# Patient Record
Sex: Female | Born: 1950 | Race: White | Hispanic: No | Smoking: Former smoker
Health system: Southern US, Community
[De-identification: ages and names within clinical notes are randomized; demographics above are authoritative.]

## PROBLEM LIST (undated history)

## (undated) DIAGNOSIS — M810 Age-related osteoporosis without current pathological fracture: Secondary | ICD-10-CM

## (undated) DIAGNOSIS — T148XXA Other injury of unspecified body region, initial encounter: Secondary | ICD-10-CM

## (undated) HISTORY — DX: Age-related osteoporosis without current pathological fracture: M81.0

## (undated) HISTORY — DX: Other injury of unspecified body region, initial encounter: T14.8XXA

---

## 2004-03-24 ENCOUNTER — Encounter: Admission: RE | Admit: 2004-03-24 | Discharge: 2004-03-24 | Payer: Self-pay | Admitting: Obstetrics and Gynecology

## 2004-04-01 ENCOUNTER — Other Ambulatory Visit: Admission: RE | Admit: 2004-04-01 | Discharge: 2004-04-01 | Payer: Self-pay | Admitting: Obstetrics and Gynecology

## 2004-07-20 ENCOUNTER — Ambulatory Visit (HOSPITAL_COMMUNITY): Admission: RE | Admit: 2004-07-20 | Discharge: 2004-07-20 | Payer: Self-pay | Admitting: Gastroenterology

## 2005-04-04 ENCOUNTER — Encounter: Admission: RE | Admit: 2005-04-04 | Discharge: 2005-04-04 | Payer: Self-pay | Admitting: Obstetrics and Gynecology

## 2005-04-21 ENCOUNTER — Other Ambulatory Visit: Admission: RE | Admit: 2005-04-21 | Discharge: 2005-04-21 | Payer: Self-pay | Admitting: Obstetrics and Gynecology

## 2006-02-13 ENCOUNTER — Emergency Department (HOSPITAL_COMMUNITY): Admission: EM | Admit: 2006-02-13 | Discharge: 2006-02-13 | Payer: Self-pay | Admitting: Emergency Medicine

## 2006-04-11 ENCOUNTER — Encounter: Admission: RE | Admit: 2006-04-11 | Discharge: 2006-04-11 | Payer: Self-pay | Admitting: Internal Medicine

## 2006-09-18 DIAGNOSIS — E039 Hypothyroidism, unspecified: Secondary | ICD-10-CM

## 2006-09-18 HISTORY — DX: Hypothyroidism, unspecified: E03.9

## 2009-09-18 DIAGNOSIS — S3992XA Unspecified injury of lower back, initial encounter: Secondary | ICD-10-CM

## 2009-09-18 HISTORY — DX: Unspecified injury of lower back, initial encounter: S39.92XA

## 2009-11-06 ENCOUNTER — Inpatient Hospital Stay (HOSPITAL_COMMUNITY): Admission: EM | Admit: 2009-11-06 | Discharge: 2009-11-09 | Payer: Self-pay | Admitting: Emergency Medicine

## 2009-12-10 ENCOUNTER — Encounter: Admission: RE | Admit: 2009-12-10 | Discharge: 2009-12-10 | Payer: Self-pay | Admitting: Obstetrics & Gynecology

## 2010-12-08 LAB — CBC
HCT: 32.9 % — ABNORMAL LOW (ref 36.0–46.0)
Hemoglobin: 11.4 g/dL — ABNORMAL LOW (ref 12.0–15.0)
Hemoglobin: 13.8 g/dL (ref 12.0–15.0)
MCHC: 34.8 g/dL (ref 30.0–36.0)
MCV: 90.1 fL (ref 78.0–100.0)
Platelets: 172 10*3/uL (ref 150–400)
RBC: 3.65 MIL/uL — ABNORMAL LOW (ref 3.87–5.11)
RBC: 4.45 MIL/uL (ref 3.87–5.11)
RDW: 12.3 % (ref 11.5–15.5)
WBC: 10.2 10*3/uL (ref 4.0–10.5)

## 2010-12-08 LAB — DIFFERENTIAL
Basophils Absolute: 0 10*3/uL (ref 0.0–0.1)
Basophils Relative: 0 % (ref 0–1)
Eosinophils Absolute: 0 10*3/uL (ref 0.0–0.7)
Eosinophils Relative: 0 % (ref 0–5)
Neutrophils Relative %: 89 % — ABNORMAL HIGH (ref 43–77)

## 2010-12-08 LAB — BASIC METABOLIC PANEL
CO2: 27 mEq/L (ref 19–32)
Creatinine, Ser: 0.8 mg/dL (ref 0.4–1.2)
GFR calc Af Amer: >60 mL/min (ref >60)
GFR calc non Af Amer: >60 mL/min (ref >60)

## 2010-12-08 LAB — COMPREHENSIVE METABOLIC PANEL
ALT: 23 U/L (ref 0–35)
AST: 29 U/L (ref 0–37)
Alkaline Phosphatase: 65 U/L (ref 39–117)
BUN: 11 mg/dL (ref 6–23)
GFR calc Af Amer: >60 mL/min (ref >60)
GFR calc non Af Amer: >60 mL/min (ref >60)
Glucose, Bld: 126 mg/dL — ABNORMAL HIGH (ref 70–99)
Potassium: 3.6 mEq/L (ref 3.5–5.1)
Sodium: 137 mEq/L (ref 135–145)
Total Bilirubin: 0.7 mg/dL (ref 0.3–1.2)

## 2010-12-08 LAB — PROTIME-INR: INR: 1.06 (ref 0.00–1.49)

## 2010-12-08 LAB — MRSA PCR SCREENING: MRSA by PCR: NEGATIVE

## 2011-02-03 NOTE — Op Note (Signed)
NAME:  Rebecca Nixon, Rebecca Nixon             ACCOUNT NO.:  [id number]   MEDICAL RECORD NO.:  [id number]          PATIENT TYPE:  AMB   LOCATION:  ENDO                         FACILITY:  [facility]   PHYSICIAN:  Anselmo Rod, M.D.  DATE OF BIRTH:  December 11, 1950   DATE OF PROCEDURE:  07/20/2004  DATE OF DISCHARGE:                                 OPERATIVE REPORT   PROCEDURE PERFORMED:  Screening colonoscopy.   ENDOSCOPIST:  Anselmo Rod, M.D.   INSTRUMENT USED:  Olympus video colonoscope.   INDICATION FOR PROCEDURE:  A 60 year old white female undergoing a screening  colonoscopy.  Rule out colonic polyps, masses, etc.   PREPROCEDURE PREPARATION:  Informed consent was procured from the patient.  The patient was fasted for eight hours prior to the procedure and prepped  with a bottle of magnesium citrate and a gallon of GoLYTELY the night prior  to the procedure.   PREPROCEDURE PHYSICAL:  VITAL SIGNS:  The patient had stable vital signs.  NECK:  Supple.  CHEST:  Clear to auscultation.  S1, S2 regular.  ABDOMEN:  Soft with normal bowel sounds.  No hepatosplenomegaly.   DESCRIPTION OF PROCEDURE:  The patient was placed in the left lateral  decubitus position and sedated with 80 mg of Demerol and 10 mg of Versed in  slow incremental doses.  Once the patient was adequately sedate and  maintained on low-flow oxyen and continuous cardiac monitoring, the Olympus  video colonoscope was advanced from the rectum to the cecum.  The  appendiceal orifice and the ileocecal valve were clearly visualized and  photographed.  No masses, polyps, erosions, ulcerations, or diverticula were  seen.  Retroflexion in the rectum revealed no abnormalities.   IMPRESSION:  Normal colonoscopy up to the cecum.  No masses, polyps,  diverticula, or hemorrhoids seen.   RECOMMENDATIONS:  1.  Continue a high-fiber diet with liberal fluid intake.  2.  Repeat colonoscopy in the next five years unless the patient develops  any abnormal symptoms in the interim.  3.  Outpatient follow-up as the need arises in the future.       JNM/MEDQ  D:  07/20/2004  T:  07/20/2004  Job:  [id number]   cc:   Dois Davenport A. Rivard, M.D.  [address], [address]  [place]  [place] [address]  Fax: [phone]   [facility]

## 2012-07-22 ENCOUNTER — Other Ambulatory Visit: Payer: Self-pay | Admitting: Family Medicine

## 2012-07-22 DIAGNOSIS — Z1231 Encounter for screening mammogram for malignant neoplasm of breast: Secondary | ICD-10-CM

## 2012-08-20 ENCOUNTER — Ambulatory Visit
Admission: RE | Admit: 2012-08-20 | Discharge: 2012-08-20 | Disposition: A | Discharge status: Home / Self Care | Payer: 59 | Source: Ambulatory Visit | Attending: Family Medicine | Admitting: Family Medicine

## 2012-08-20 DIAGNOSIS — Z1231 Encounter for screening mammogram for malignant neoplasm of breast: Secondary | ICD-10-CM

## 2013-11-17 ENCOUNTER — Other Ambulatory Visit: Payer: Self-pay | Admitting: Family Medicine

## 2013-11-17 DIAGNOSIS — Z1231 Encounter for screening mammogram for malignant neoplasm of breast: Secondary | ICD-10-CM

## 2013-12-15 ENCOUNTER — Ambulatory Visit
Admission: RE | Admit: 2013-12-15 | Discharge: 2013-12-15 | Disposition: A | Discharge status: Home / Self Care | Payer: Self-pay | Source: Ambulatory Visit | Attending: Family Medicine | Admitting: Family Medicine

## 2013-12-15 DIAGNOSIS — Z1231 Encounter for screening mammogram for malignant neoplasm of breast: Secondary | ICD-10-CM

## 2014-09-18 DIAGNOSIS — E559 Vitamin D deficiency, unspecified: Secondary | ICD-10-CM

## 2014-09-18 HISTORY — DX: Vitamin D deficiency, unspecified: E55.9

## 2015-05-10 ENCOUNTER — Other Ambulatory Visit: Payer: Self-pay

## 2015-05-10 DIAGNOSIS — Z1231 Encounter for screening mammogram for malignant neoplasm of breast: Secondary | ICD-10-CM

## 2015-05-12 ENCOUNTER — Other Ambulatory Visit: Payer: Self-pay | Admitting: Family Medicine

## 2015-05-12 DIAGNOSIS — E2839 Other primary ovarian failure: Secondary | ICD-10-CM

## 2015-05-26 ENCOUNTER — Ambulatory Visit
Admission: RE | Admit: 2015-05-26 | Discharge: 2015-05-26 | Disposition: A | Discharge status: Home / Self Care | Payer: Managed Care, Other (non HMO) | Source: Ambulatory Visit

## 2015-05-26 DIAGNOSIS — Z1231 Encounter for screening mammogram for malignant neoplasm of breast: Secondary | ICD-10-CM

## 2015-07-30 HISTORY — PX: BACK SURGERY: SHX140

## 2016-05-05 ENCOUNTER — Other Ambulatory Visit: Payer: Self-pay | Admitting: Internal Medicine

## 2016-05-05 DIAGNOSIS — Z1231 Encounter for screening mammogram for malignant neoplasm of breast: Secondary | ICD-10-CM

## 2016-05-26 ENCOUNTER — Other Ambulatory Visit: Payer: Self-pay | Admitting: Internal Medicine

## 2016-05-26 ENCOUNTER — Ambulatory Visit
Admission: RE | Admit: 2016-05-26 | Discharge: 2016-05-26 | Disposition: A | Discharge status: Home / Self Care | Payer: Managed Care, Other (non HMO) | Source: Ambulatory Visit | Attending: Internal Medicine | Admitting: Internal Medicine

## 2016-05-26 DIAGNOSIS — Z1231 Encounter for screening mammogram for malignant neoplasm of breast: Secondary | ICD-10-CM

## 2016-05-26 DIAGNOSIS — E2839 Other primary ovarian failure: Secondary | ICD-10-CM

## 2016-10-10 DIAGNOSIS — Z0000 Encounter for general adult medical examination without abnormal findings: Secondary | ICD-10-CM | POA: Diagnosis not present

## 2017-08-02 ENCOUNTER — Other Ambulatory Visit: Payer: Self-pay | Admitting: Internal Medicine

## 2017-08-02 DIAGNOSIS — Z139 Encounter for screening, unspecified: Secondary | ICD-10-CM

## 2017-08-31 ENCOUNTER — Ambulatory Visit
Admission: RE | Admit: 2017-08-31 | Discharge: 2017-08-31 | Disposition: A | Discharge status: Home / Self Care | Payer: Managed Care, Other (non HMO) | Source: Ambulatory Visit | Attending: Internal Medicine | Admitting: Internal Medicine

## 2017-08-31 DIAGNOSIS — Z1231 Encounter for screening mammogram for malignant neoplasm of breast: Secondary | ICD-10-CM | POA: Diagnosis not present

## 2017-08-31 DIAGNOSIS — Z139 Encounter for screening, unspecified: Secondary | ICD-10-CM

## 2017-09-18 DIAGNOSIS — E785 Hyperlipidemia, unspecified: Secondary | ICD-10-CM

## 2017-09-18 HISTORY — DX: Hyperlipidemia, unspecified: E78.5

## 2017-11-12 DIAGNOSIS — E038 Other specified hypothyroidism: Secondary | ICD-10-CM | POA: Diagnosis not present

## 2017-11-12 DIAGNOSIS — Z0000 Encounter for general adult medical examination without abnormal findings: Secondary | ICD-10-CM | POA: Diagnosis not present

## 2017-11-12 DIAGNOSIS — E559 Vitamin D deficiency, unspecified: Secondary | ICD-10-CM | POA: Diagnosis not present

## 2017-11-19 DIAGNOSIS — E559 Vitamin D deficiency, unspecified: Secondary | ICD-10-CM | POA: Diagnosis not present

## 2017-11-19 DIAGNOSIS — E038 Other specified hypothyroidism: Secondary | ICD-10-CM | POA: Diagnosis not present

## 2017-11-19 DIAGNOSIS — E7849 Other hyperlipidemia: Secondary | ICD-10-CM | POA: Diagnosis not present

## 2017-11-19 DIAGNOSIS — Z0000 Encounter for general adult medical examination without abnormal findings: Secondary | ICD-10-CM | POA: Diagnosis not present

## 2017-11-21 DIAGNOSIS — Z1212 Encounter for screening for malignant neoplasm of rectum: Secondary | ICD-10-CM | POA: Diagnosis not present

## 2018-01-14 DIAGNOSIS — N952 Postmenopausal atrophic vaginitis: Secondary | ICD-10-CM | POA: Diagnosis not present

## 2018-07-31 ENCOUNTER — Other Ambulatory Visit: Payer: Self-pay | Admitting: Internal Medicine

## 2018-07-31 DIAGNOSIS — Z1231 Encounter for screening mammogram for malignant neoplasm of breast: Secondary | ICD-10-CM

## 2018-09-12 ENCOUNTER — Encounter: Payer: Self-pay | Admitting: Radiology

## 2018-09-12 ENCOUNTER — Ambulatory Visit
Admission: RE | Admit: 2018-09-12 | Discharge: 2018-09-12 | Disposition: A | Discharge status: Home / Self Care | Payer: BLUE CROSS/BLUE SHIELD | Source: Ambulatory Visit | Attending: Internal Medicine | Admitting: Internal Medicine

## 2018-09-12 DIAGNOSIS — Z1231 Encounter for screening mammogram for malignant neoplasm of breast: Secondary | ICD-10-CM

## 2018-12-13 DIAGNOSIS — E7849 Other hyperlipidemia: Secondary | ICD-10-CM | POA: Diagnosis not present

## 2018-12-13 DIAGNOSIS — E559 Vitamin D deficiency, unspecified: Secondary | ICD-10-CM | POA: Diagnosis not present

## 2018-12-13 DIAGNOSIS — E038 Other specified hypothyroidism: Secondary | ICD-10-CM | POA: Diagnosis not present

## 2018-12-31 DIAGNOSIS — Z0000 Encounter for general adult medical examination without abnormal findings: Secondary | ICD-10-CM | POA: Diagnosis not present

## 2018-12-31 DIAGNOSIS — E039 Hypothyroidism, unspecified: Secondary | ICD-10-CM | POA: Diagnosis not present

## 2018-12-31 DIAGNOSIS — E559 Vitamin D deficiency, unspecified: Secondary | ICD-10-CM | POA: Diagnosis not present

## 2018-12-31 DIAGNOSIS — E785 Hyperlipidemia, unspecified: Secondary | ICD-10-CM | POA: Diagnosis not present

## 2018-12-31 DIAGNOSIS — Z1331 Encounter for screening for depression: Secondary | ICD-10-CM | POA: Diagnosis not present

## 2019-03-07 DIAGNOSIS — Z01419 Encounter for gynecological examination (general) (routine) without abnormal findings: Secondary | ICD-10-CM | POA: Diagnosis not present

## 2019-03-07 DIAGNOSIS — Z6820 Body mass index (BMI) 20.0-20.9, adult: Secondary | ICD-10-CM | POA: Diagnosis not present

## 2019-09-15 ENCOUNTER — Other Ambulatory Visit: Payer: Self-pay | Admitting: Internal Medicine

## 2019-09-15 DIAGNOSIS — Z1231 Encounter for screening mammogram for malignant neoplasm of breast: Secondary | ICD-10-CM

## 2019-10-24 ENCOUNTER — Ambulatory Visit
Admission: RE | Admit: 2019-10-24 | Discharge: 2019-10-24 | Disposition: A | Discharge status: Home / Self Care | Payer: BLUE CROSS/BLUE SHIELD | Source: Ambulatory Visit | Attending: Internal Medicine | Admitting: Internal Medicine

## 2019-10-24 ENCOUNTER — Other Ambulatory Visit: Payer: Self-pay

## 2019-10-24 DIAGNOSIS — Z1231 Encounter for screening mammogram for malignant neoplasm of breast: Secondary | ICD-10-CM

## 2020-01-13 DIAGNOSIS — Z0000 Encounter for general adult medical examination without abnormal findings: Secondary | ICD-10-CM | POA: Diagnosis not present

## 2020-01-13 DIAGNOSIS — E038 Other specified hypothyroidism: Secondary | ICD-10-CM | POA: Diagnosis not present

## 2020-01-13 DIAGNOSIS — E559 Vitamin D deficiency, unspecified: Secondary | ICD-10-CM | POA: Diagnosis not present

## 2020-01-13 DIAGNOSIS — M810 Age-related osteoporosis without current pathological fracture: Secondary | ICD-10-CM | POA: Diagnosis not present

## 2020-01-13 DIAGNOSIS — E7849 Other hyperlipidemia: Secondary | ICD-10-CM | POA: Diagnosis not present

## 2020-01-20 DIAGNOSIS — Z0000 Encounter for general adult medical examination without abnormal findings: Secondary | ICD-10-CM | POA: Diagnosis not present

## 2020-01-20 DIAGNOSIS — E039 Hypothyroidism, unspecified: Secondary | ICD-10-CM | POA: Diagnosis not present

## 2020-01-20 DIAGNOSIS — M810 Age-related osteoporosis without current pathological fracture: Secondary | ICD-10-CM | POA: Diagnosis not present

## 2020-01-20 DIAGNOSIS — Z1331 Encounter for screening for depression: Secondary | ICD-10-CM | POA: Diagnosis not present

## 2020-01-20 DIAGNOSIS — E785 Hyperlipidemia, unspecified: Secondary | ICD-10-CM | POA: Diagnosis not present

## 2020-01-29 DIAGNOSIS — Z1212 Encounter for screening for malignant neoplasm of rectum: Secondary | ICD-10-CM | POA: Diagnosis not present

## 2020-03-09 DIAGNOSIS — Z01419 Encounter for gynecological examination (general) (routine) without abnormal findings: Secondary | ICD-10-CM | POA: Diagnosis not present

## 2020-03-09 DIAGNOSIS — Z6820 Body mass index (BMI) 20.0-20.9, adult: Secondary | ICD-10-CM | POA: Diagnosis not present

## 2020-03-28 DIAGNOSIS — T7840XA Allergy, unspecified, initial encounter: Secondary | ICD-10-CM | POA: Diagnosis not present

## 2020-05-05 DIAGNOSIS — E039 Hypothyroidism, unspecified: Secondary | ICD-10-CM | POA: Diagnosis not present

## 2020-05-17 DIAGNOSIS — H52203 Unspecified astigmatism, bilateral: Secondary | ICD-10-CM | POA: Diagnosis not present

## 2020-05-17 DIAGNOSIS — H2513 Age-related nuclear cataract, bilateral: Secondary | ICD-10-CM | POA: Diagnosis not present

## 2020-05-17 DIAGNOSIS — H5203 Hypermetropia, bilateral: Secondary | ICD-10-CM | POA: Diagnosis not present

## 2020-10-27 ENCOUNTER — Other Ambulatory Visit: Payer: Self-pay | Admitting: Internal Medicine

## 2020-10-27 DIAGNOSIS — Z1231 Encounter for screening mammogram for malignant neoplasm of breast: Secondary | ICD-10-CM

## 2020-12-20 ENCOUNTER — Inpatient Hospital Stay: Admission: RE | Admit: 2020-12-20 | Payer: BC Managed Care – PPO | Source: Ambulatory Visit

## 2020-12-30 ENCOUNTER — Other Ambulatory Visit: Payer: Self-pay

## 2020-12-30 ENCOUNTER — Ambulatory Visit
Admission: RE | Admit: 2020-12-30 | Discharge: 2020-12-30 | Disposition: A | Discharge status: Home / Self Care | Payer: BC Managed Care – PPO | Source: Ambulatory Visit | Attending: Internal Medicine | Admitting: Internal Medicine

## 2020-12-30 DIAGNOSIS — Z1231 Encounter for screening mammogram for malignant neoplasm of breast: Secondary | ICD-10-CM

## 2021-01-19 DIAGNOSIS — E039 Hypothyroidism, unspecified: Secondary | ICD-10-CM | POA: Diagnosis not present

## 2021-01-19 DIAGNOSIS — E559 Vitamin D deficiency, unspecified: Secondary | ICD-10-CM | POA: Diagnosis not present

## 2021-01-19 DIAGNOSIS — E785 Hyperlipidemia, unspecified: Secondary | ICD-10-CM | POA: Diagnosis not present

## 2021-01-26 DIAGNOSIS — Z1331 Encounter for screening for depression: Secondary | ICD-10-CM | POA: Diagnosis not present

## 2021-01-26 DIAGNOSIS — Z1212 Encounter for screening for malignant neoplasm of rectum: Secondary | ICD-10-CM | POA: Diagnosis not present

## 2021-01-26 DIAGNOSIS — R82998 Other abnormal findings in urine: Secondary | ICD-10-CM | POA: Diagnosis not present

## 2021-01-26 DIAGNOSIS — E039 Hypothyroidism, unspecified: Secondary | ICD-10-CM | POA: Diagnosis not present

## 2021-01-26 DIAGNOSIS — Z23 Encounter for immunization: Secondary | ICD-10-CM | POA: Diagnosis not present

## 2021-01-26 DIAGNOSIS — Z0000 Encounter for general adult medical examination without abnormal findings: Secondary | ICD-10-CM | POA: Diagnosis not present

## 2021-01-28 ENCOUNTER — Other Ambulatory Visit: Payer: Self-pay | Admitting: Internal Medicine

## 2021-01-28 DIAGNOSIS — E785 Hyperlipidemia, unspecified: Secondary | ICD-10-CM

## 2021-03-08 ENCOUNTER — Ambulatory Visit
Admission: RE | Admit: 2021-03-08 | Discharge: 2021-03-08 | Disposition: A | Discharge status: Home / Self Care | Payer: No Typology Code available for payment source | Source: Ambulatory Visit | Attending: Internal Medicine | Admitting: Internal Medicine

## 2021-03-08 DIAGNOSIS — E785 Hyperlipidemia, unspecified: Secondary | ICD-10-CM

## 2021-04-13 DIAGNOSIS — Z6821 Body mass index (BMI) 21.0-21.9, adult: Secondary | ICD-10-CM | POA: Diagnosis not present

## 2021-04-13 DIAGNOSIS — Z01419 Encounter for gynecological examination (general) (routine) without abnormal findings: Secondary | ICD-10-CM | POA: Diagnosis not present

## 2021-04-13 DIAGNOSIS — N952 Postmenopausal atrophic vaginitis: Secondary | ICD-10-CM | POA: Diagnosis not present

## 2021-06-16 DIAGNOSIS — D485 Neoplasm of uncertain behavior of skin: Secondary | ICD-10-CM | POA: Diagnosis not present

## 2021-06-16 DIAGNOSIS — L570 Actinic keratosis: Secondary | ICD-10-CM | POA: Diagnosis not present

## 2021-06-16 DIAGNOSIS — L821 Other seborrheic keratosis: Secondary | ICD-10-CM | POA: Diagnosis not present

## 2021-06-16 DIAGNOSIS — C44612 Basal cell carcinoma of skin of right upper limb, including shoulder: Secondary | ICD-10-CM | POA: Diagnosis not present

## 2021-06-16 DIAGNOSIS — D225 Melanocytic nevi of trunk: Secondary | ICD-10-CM | POA: Diagnosis not present

## 2021-06-20 ENCOUNTER — Ambulatory Visit: Payer: BC Managed Care – PPO | Admitting: Cardiovascular Disease

## 2021-06-20 ENCOUNTER — Encounter: Payer: Self-pay | Admitting: Cardiovascular Disease

## 2021-06-20 ENCOUNTER — Other Ambulatory Visit: Payer: Self-pay

## 2021-06-20 VITALS — BP 108/60 | HR 71 | Ht 66.0 in | Wt 132.2 lb

## 2021-06-20 DIAGNOSIS — I2510 Atherosclerotic heart disease of native coronary artery without angina pectoris: Secondary | ICD-10-CM

## 2021-06-20 MED ORDER — ROSUVASTATIN CALCIUM 5 MG PO TABS: 5.0000 mg | ORAL_TABLET | Freq: Every day | ORAL | 3 refills | Status: AC

## 2021-06-20 MED ORDER — ASPIRIN EC 81 MG PO TBEC: 81.0000 mg | DELAYED_RELEASE_TABLET | Freq: Every day | ORAL | 3 refills | Status: AC

## 2021-06-20 NOTE — Progress Notes (Signed)
Chief Complaint  Patient presents with   New Patient (Initial Visit)    CAD-Abnormal coronary calcium score     History of Present Illness: 70 yo female with history of hypothyroidism and borderline hyperlipidemia who is here today as a new consult, referred by Dr. Waynard Edwards, for further evaluation of an abnormal coronary calcium score of 184. She has had no known cardiac issues. She is very active. She still works as a Engineer, civil (consulting). I took care of her husband Deniece Portela before he passed away in Jan 11, 2017. She has no chest pain or dyspnea. No LE edema, palpitations or dizziness. Coronary calcium score arranged as part of screening workup.   Primary Care Physician: Rodrigo Ran, MD   Past Medical History:  Diagnosis Date   Age-related osteoporosis without current pathological fracture    CONTINUE BONIVA, RECHECK IN A YEAR   Back injury 01/11/10   FELL OFF A HORSE, HAD A COUPLE OF CRUSHED VERTEBRAE AND HAD SURGERY FUSION, DR. Danielle Dess   Broken bones    FROM THE BACK   Hypothyroid 01/12/2007   Mild hyperlipidemia 2018-01-11   Vitamin D insufficiency Jan 12, 2015    Past Surgical History:  Procedure Laterality Date   BACK SURGERY  07/30/2015   BACK FUSION    Current Outpatient Medications  Medication Sig Dispense Refill   levothyroxine (SYNTHROID) 88 MCG tablet Take 1 tablet by mouth daily.     No current facility-administered medications for this visit.    No Known Allergies  Social History   Socioeconomic History   Marital status: Widowed    Spouse name: Not on file   Number of children: Not on file   Years of education: Not on file   Highest education level: Not on file  Occupational History   Occupation: Works at [employer] of [employer]  Tobacco Use   Smoking status: Former    Types: Cigarettes    Quit date: 1996    Years since quitting: 26.7   Smokeless tobacco: Former  Substance and Sexual Activity   Alcohol use: Never   Drug use: Never   Sexual activity: Not on file  Other Topics Concern   Not  on file  Social History Narrative   Not on file   Social Determinants of Health   Financial Resource Strain: Not on file  Food Insecurity: Not on file  Transportation Needs: Not on file  Physical Activity: Not on file  Stress: Not on file  Social Connections: Not on file  Intimate Partner Violence: Not on file    Family History  Problem Relation Age of Onset   Thyroid disease Mother    Hypertension Mother    Alcohol abuse Father    Hyperlipidemia Brother    Hypertension Brother    Diabetes type II Brother    Arthritis Brother    Breast cancer Neg Hx     Review of Systems:  As stated in the HPI and otherwise negative.   BP 108/60 (BP Location: Left Arm, Patient Position: Sitting, Cuff Size: Normal)   Pulse 71   Ht 5\' 6"  (1.676 m)   Wt 132 lb 3.2 oz (60 kg)   SpO2 97%   BMI 21.34 kg/m   Physical Examination: General: Well developed, well nourished, NAD  HEENT: OP clear, mucus membranes moist  SKIN: warm, dry. No rashes. Neuro: No focal deficits  Musculoskeletal: Muscle strength 5/5 all ext  Psychiatric: Mood and affect normal  Neck: No JVD, no carotid bruits, no thyromegaly, no lymphadenopathy.  Lungs:Clear bilaterally, no wheezes, rhonci, crackles Cardiovascular: Regular rate and rhythm. No murmurs, gallops or rubs. Abdomen:Soft. Bowel sounds present. Non-tender.  Extremities: No lower extremity edema. Pulses are 2 + in the bilateral DP/PT.  EKG:  EKG is ordered today. The ekg ordered today demonstrates sinus  Recent Labs: No results found for requested labs within last 8760 hours.   Lipid Panel No results found for: CHOL, TRIG, HDL, CHOLHDL, VLDL, LDLCALC, LDLDIRECT   Wt Readings from Last 3 Encounters:  06/20/21 132 lb 3.2 oz (60 kg)    Assessment and Plan:   1. CAD without angina: Abnormal coronary artery calcium score with no concerning signs or symptoms of obstructive CAD. At this time, an ischemic workup is not necessary. Will start ASA 81 mg daily  and Crestor 5 mg daily. If she cannot tolerate Crestor, she will consider Repatha injections as discussed with Dr. Waynard Edwards. She will need lipids and LFTs in 12 weeks. She will discuss this with Dr. Waynard Edwards.   Current medicines are reviewed at length with the patient today.  The patient does not have concerns regarding medicines.  The following changes have been made:  no change  Labs/ tests ordered today include:  No orders of the defined types were placed in this encounter.    Disposition:   F/U with me in one year.    Signed, Verne Carrow, MD 06/20/2021 3:54 PM    [facility] [address] [place], [place], [place]  [address] Phone: [phone]; Fax: [phone]

## 2021-06-20 NOTE — Patient Instructions (Signed)
Medication Instructions:  Your physician has recommended you make the following change in your medication:  1.) start aspirin 81 mg daily 2.) start rosuvastatin (Crestor) 5 mg - one tablet daily  *If you need a refill on your cardiac medications before your next appointment, please call your pharmacy*   Lab Work: none If you have labs (blood work) drawn today and your tests are completely normal, you will receive your results only by: MyChart Message (if you have MyChart) OR A paper copy in the mail If you have any lab test that is abnormal or we need to change your treatment, we will call you to review the results.   Testing/Procedures: none   Follow-Up: At [facility], you and your health needs are our priority.  As part of our continuing mission to provide you with exceptional heart care, we have created designated Provider Care Teams.  These Care Teams include your primary Cardiologist (physician) and Advanced Practice Providers (APPs -  Physician Assistants and Nurse Practitioners) who all work together to provide you with the care you need, when you need it.  We recommend signing up for the patient portal called "MyChart".  Sign up information is provided on this After Visit Summary.  MyChart is used to connect with patients for Virtual Visits (Telemedicine).  Patients are able to view lab/test results, encounter notes, upcoming appointments, etc.  Non-urgent messages can be sent to your provider as well.   To learn more about what you can do with MyChart, go to [email or web address].    Your next appointment:   12 year(s)  The format for your next appointment:   In Person  Provider:   You may see Verne Carrow, MD or one of the following Advanced Practice Providers on your designated Care Team:   Ronie Spies, PA-C Jacolyn Reedy, PA-C   Other Instructions

## 2021-07-04 DIAGNOSIS — C44612 Basal cell carcinoma of skin of right upper limb, including shoulder: Secondary | ICD-10-CM | POA: Diagnosis not present

## 2021-07-25 IMAGING — CT CT CARDIAC CORONARY ARTERY CALCIUM SCORE
3 series · 13 of 20 positions shown, 15 images · non-contrast
Comparison: None.

CLINICAL DATA: 70-year-old Caucasian female with history of
hyperlipidemia and former smoking history.

EXAM:
CT CARDIAC CORONARY ARTERY CALCIUM SCORE
TECHNIQUE: Non-contrast imaging through the heart was performed using
prospective ECG gating. Image post processing was performed on an
independent workstation, allowing for quantitative analysis of the
heart and coronary arteries. Note that this exam targets the heart
and the chest was not imaged in its entirety.

[Series 2: calcium scoring 2.00 qr36 bestdiast 70% hrt calciu · axial · 0.32mm/px · z∈[+1759,+1815]mm · 3 of 71 slices shown]
[im 15/71  vessel]
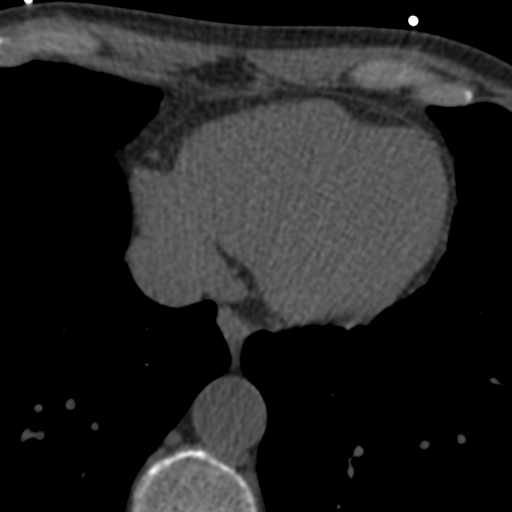
[im 29/71  vessel]
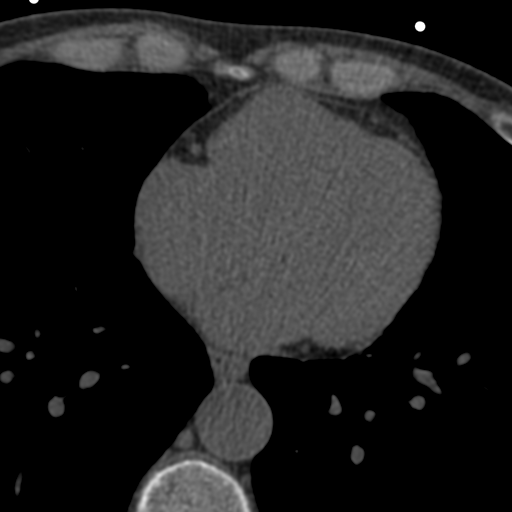
[im 43/71  vessel]
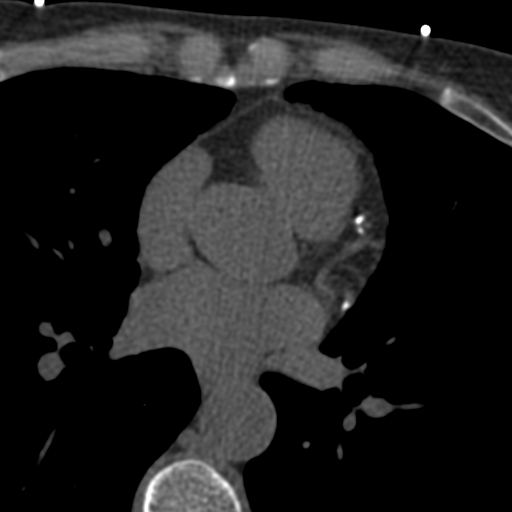

[Series 3: calcium scoring 2.00 br40 bestdiast 70% axial · axial · 0.51mm/px · z∈[+1753,+1847]mm · 5 of 71 slices shown, 7 images]
[im 12/71  vessel]
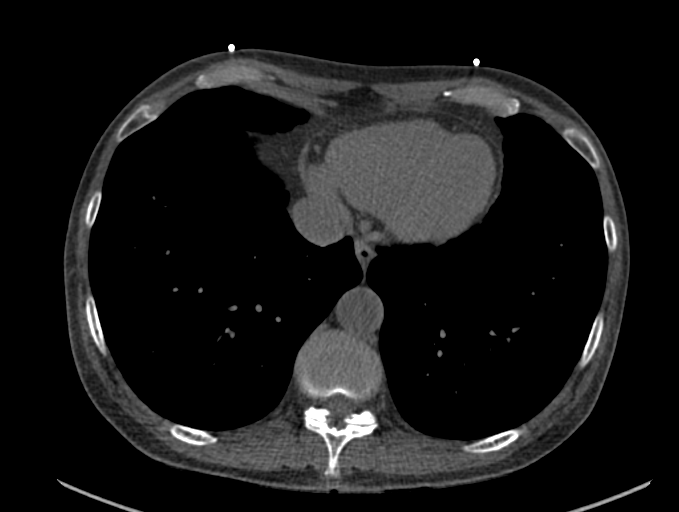
[im 12/71  lung]
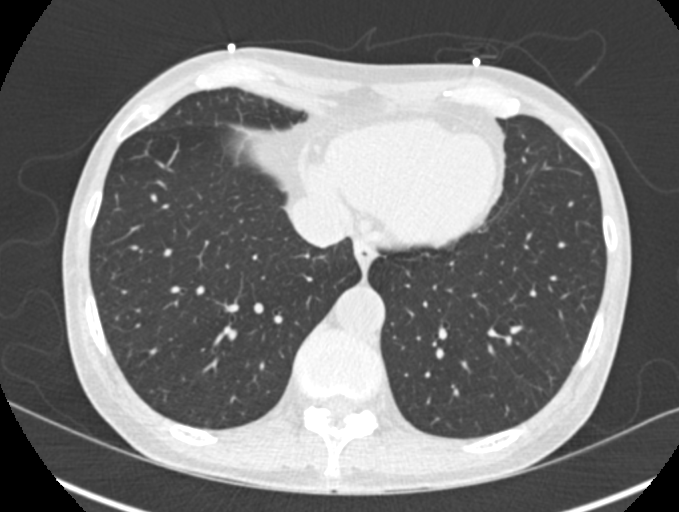
[im 24/71  vessel]
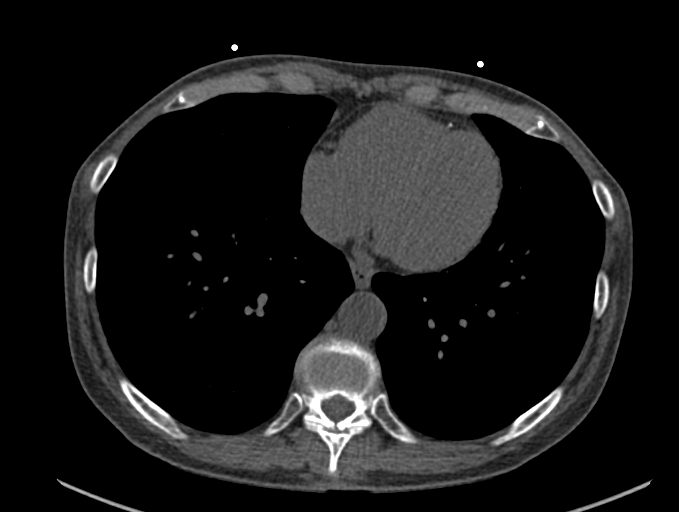
[im 36/71  vessel]
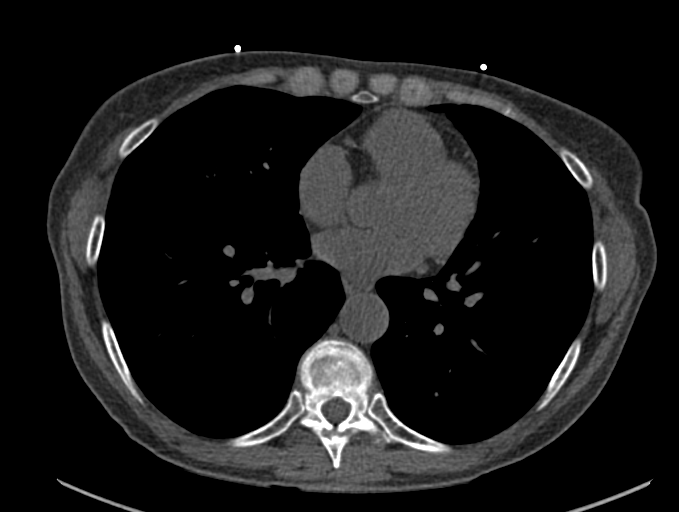
[im 47/71  vessel]
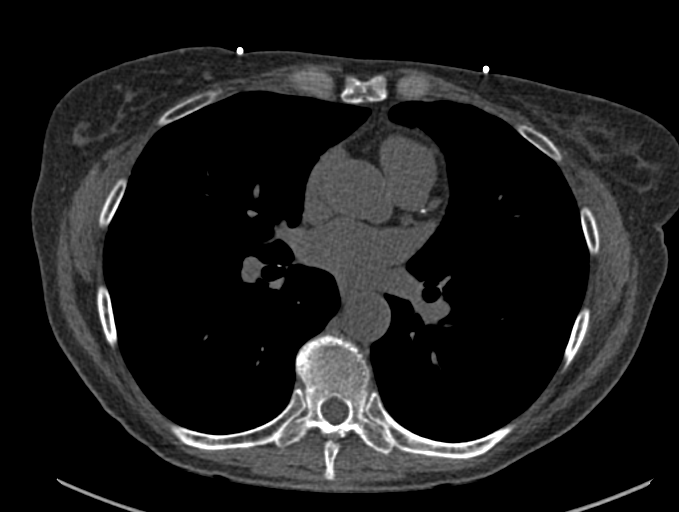
[im 59/71  vessel]
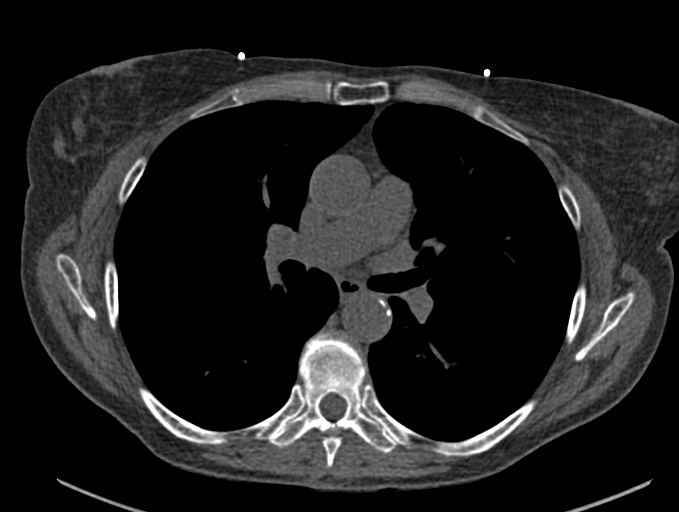
[im 59/71  lung]
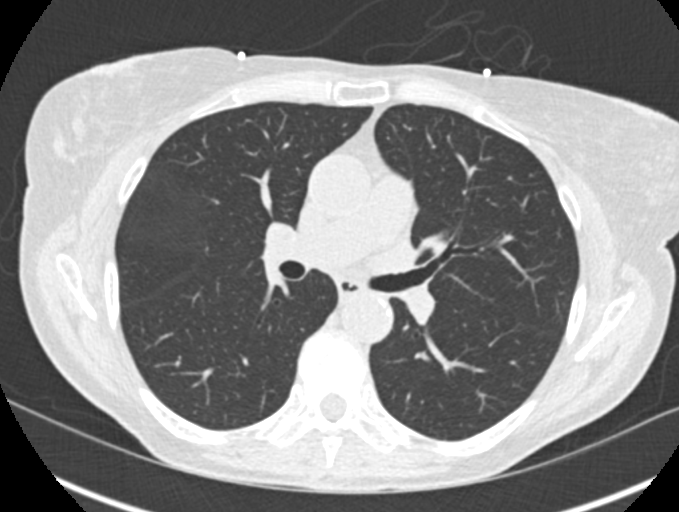

[Series 9: calcium scoring 2.00 br60 bestdiast 70% lungs · axial · 0.51mm/px · z∈[+1753,+1847]mm · 5 of 71 slices shown]
[im 12/71  vessel]
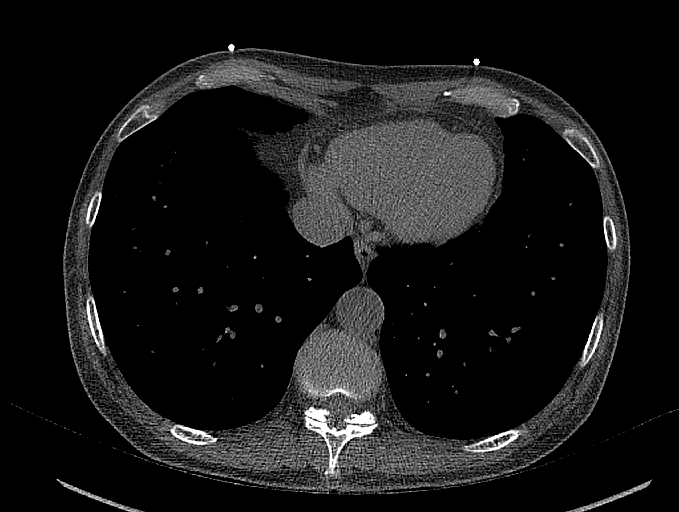
[im 24/71  vessel]
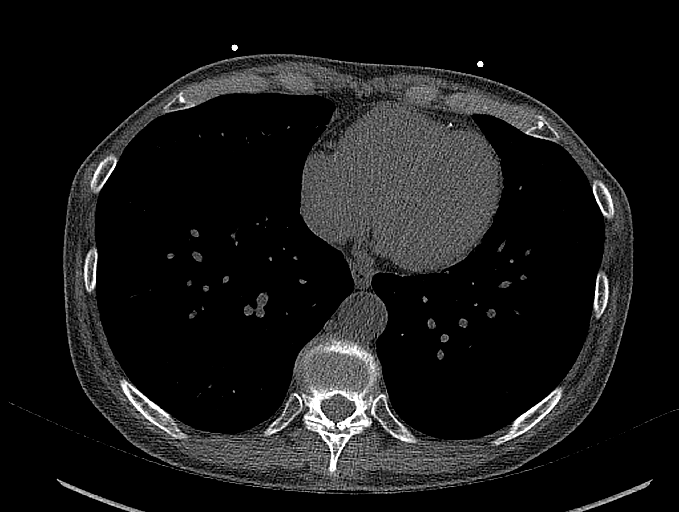
[im 36/71  vessel]
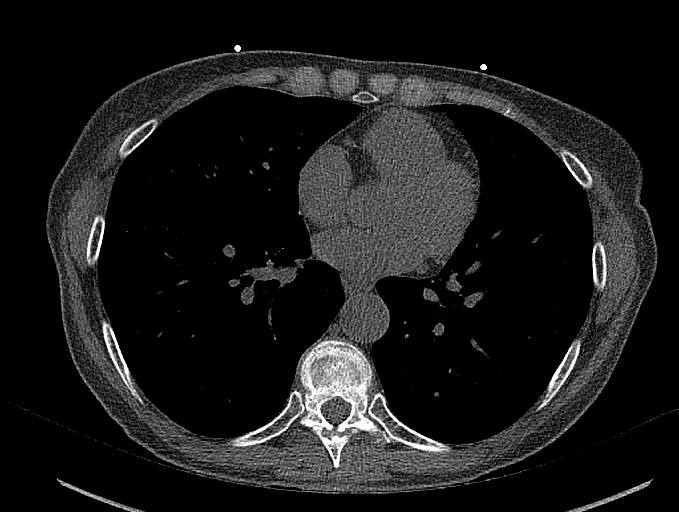
[im 47/71  vessel]
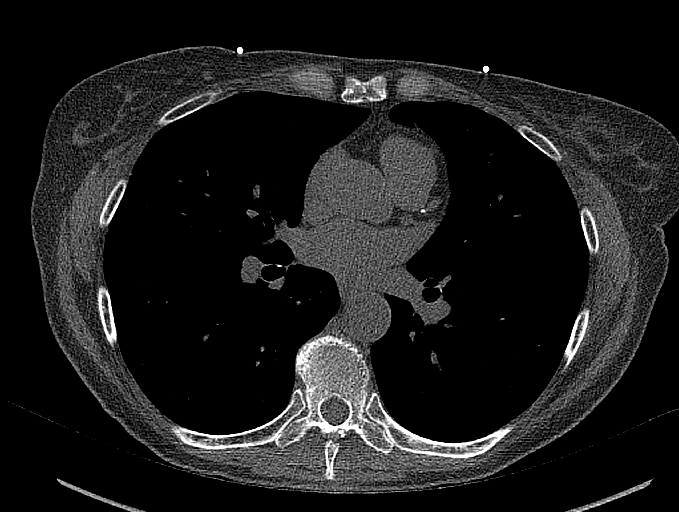
[im 59/71  vessel]
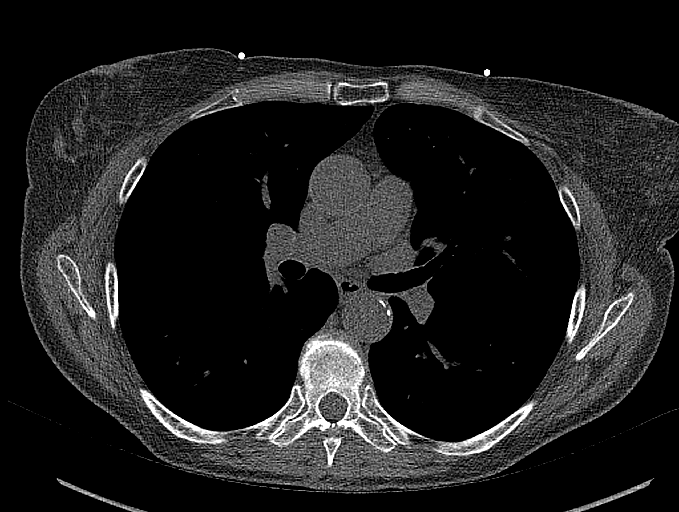

[13 of 20 positions shown; findings below may reference images not displayed]

FINDINGS: CORONARY CALCIUM SCORES:

Left Main: 0

LAD: 190

LCx: 58

RCA: 30

Total Agatston Score: 278

[HOSPITAL] percentile: 84

AORTA MEASUREMENTS:

Ascending Aorta: 30 mm

Descending Aorta: 24 mm

OTHER FINDINGS:

The heart size is within normal limits. No pericardial fluid
identified. Visualized segments of the thoracic aorta and central
pulmonary arteries are of normal caliber. Mild scattered calcified
plaque is present in the descending thoracic aorta. The visualized
mediastinum and hilar regions demonstrate no lymphadenopathy or
masses. Visualized lungs show no evidence of pulmonary edema,
consolidation, pneumothorax, nodule or pleural fluid. Visualized
upper abdomen and bony structures are unremarkable.
IMPRESSION: 1. Coronary calcium score of 278 is at the 84th percentile for the
patient's age, sex and race.
2. Atherosclerosis of the descending thoracic aorta.

## 2021-12-26 ENCOUNTER — Other Ambulatory Visit: Payer: Self-pay | Admitting: Internal Medicine

## 2021-12-26 DIAGNOSIS — Z1231 Encounter for screening mammogram for malignant neoplasm of breast: Secondary | ICD-10-CM

## 2022-01-24 ENCOUNTER — Ambulatory Visit
Admission: RE | Admit: 2022-01-24 | Discharge: 2022-01-24 | Disposition: A | Discharge status: Home / Self Care | Payer: BC Managed Care – PPO | Source: Ambulatory Visit | Attending: Internal Medicine | Admitting: Internal Medicine

## 2022-01-24 DIAGNOSIS — Z1231 Encounter for screening mammogram for malignant neoplasm of breast: Secondary | ICD-10-CM

## 2022-02-14 DIAGNOSIS — E785 Hyperlipidemia, unspecified: Secondary | ICD-10-CM | POA: Diagnosis not present

## 2022-02-14 DIAGNOSIS — Z0000 Encounter for general adult medical examination without abnormal findings: Secondary | ICD-10-CM | POA: Diagnosis not present

## 2022-02-14 DIAGNOSIS — E039 Hypothyroidism, unspecified: Secondary | ICD-10-CM | POA: Diagnosis not present

## 2022-02-14 DIAGNOSIS — E559 Vitamin D deficiency, unspecified: Secondary | ICD-10-CM | POA: Diagnosis not present

## 2022-02-20 DIAGNOSIS — E039 Hypothyroidism, unspecified: Secondary | ICD-10-CM | POA: Diagnosis not present

## 2022-02-20 DIAGNOSIS — Z0000 Encounter for general adult medical examination without abnormal findings: Secondary | ICD-10-CM | POA: Diagnosis not present

## 2023-11-26 ENCOUNTER — Other Ambulatory Visit: Payer: Self-pay | Admitting: Internal Medicine

## 2023-11-26 DIAGNOSIS — Z1231 Encounter for screening mammogram for malignant neoplasm of breast: Secondary | ICD-10-CM

## 2023-12-07 ENCOUNTER — Ambulatory Visit

## 2023-12-14 ENCOUNTER — Ambulatory Visit
Admission: RE | Admit: 2023-12-14 | Discharge: 2023-12-14 | Disposition: A | Discharge status: Home / Self Care | Source: Ambulatory Visit | Attending: Internal Medicine | Admitting: Internal Medicine

## 2023-12-14 DIAGNOSIS — Z1231 Encounter for screening mammogram for malignant neoplasm of breast: Secondary | ICD-10-CM
